# Patient Record
Sex: Male | Born: 1985 | Race: Black or African American | Hispanic: No | Marital: Single | Smoking: Former smoker
Health system: Southern US, Community
[De-identification: ages and names within clinical notes are randomized; demographics above are authoritative.]

---

## 2013-11-22 ENCOUNTER — Emergency Department (HOSPITAL_COMMUNITY): Payer: No Typology Code available for payment source

## 2013-11-22 ENCOUNTER — Emergency Department (HOSPITAL_COMMUNITY)
Admission: EM | Admit: 2013-11-22 | Discharge: 2013-11-22 | Disposition: A | Discharge status: Home / Self Care | Payer: No Typology Code available for payment source | Attending: Emergency Medicine | Admitting: Emergency Medicine

## 2013-11-22 ENCOUNTER — Encounter (HOSPITAL_COMMUNITY): Payer: Self-pay | Admitting: Emergency Medicine

## 2013-11-22 DIAGNOSIS — Z87891 Personal history of nicotine dependence: Secondary | ICD-10-CM | POA: Diagnosis not present

## 2013-11-22 DIAGNOSIS — M545 Low back pain, unspecified: Secondary | ICD-10-CM

## 2013-11-22 DIAGNOSIS — S199XXA Unspecified injury of neck, initial encounter: Secondary | ICD-10-CM | POA: Insufficient documentation

## 2013-11-22 DIAGNOSIS — M62838 Other muscle spasm: Secondary | ICD-10-CM

## 2013-11-22 DIAGNOSIS — S3992XA Unspecified injury of lower back, initial encounter: Secondary | ICD-10-CM | POA: Diagnosis not present

## 2013-11-22 DIAGNOSIS — Y92410 Unspecified street and highway as the place of occurrence of the external cause: Secondary | ICD-10-CM | POA: Insufficient documentation

## 2013-11-22 DIAGNOSIS — M546 Pain in thoracic spine: Secondary | ICD-10-CM

## 2013-11-22 DIAGNOSIS — R5383 Other fatigue: Secondary | ICD-10-CM | POA: Insufficient documentation

## 2013-11-22 DIAGNOSIS — S0990XA Unspecified injury of head, initial encounter: Secondary | ICD-10-CM | POA: Insufficient documentation

## 2013-11-22 DIAGNOSIS — Y9389 Activity, other specified: Secondary | ICD-10-CM | POA: Diagnosis not present

## 2013-11-22 MED ORDER — HYDROCODONE-ACETAMINOPHEN 5-325 MG PO TABS: 1.0000 | ORAL_TABLET | Freq: Four times a day (QID) | ORAL | Status: DC | PRN

## 2013-11-22 MED ORDER — HYDROCODONE-ACETAMINOPHEN 5-325 MG PO TABS
1.0000 | ORAL_TABLET | Freq: Once | ORAL | Status: AC
  Administered 2013-11-22: 1 via ORAL
  Filled 2013-11-22: qty 1

## 2013-11-22 MED ORDER — NAPROXEN 500 MG PO TABS: 500.0000 mg | ORAL_TABLET | Freq: Two times a day (BID) | ORAL | Status: AC

## 2013-11-22 MED ORDER — DIAZEPAM 5 MG PO TABS: 5.0000 mg | ORAL_TABLET | Freq: Four times a day (QID) | ORAL | Status: DC | PRN

## 2013-11-22 MED ORDER — DIAZEPAM 5 MG PO TABS
5.0000 mg | ORAL_TABLET | Freq: Once | ORAL | Status: AC
  Administered 2013-11-22: 5 mg via ORAL
  Filled 2013-11-22: qty 1
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
## 2013-11-23 NOTE — ED Provider Notes (Signed)
Medical screening examination/treatment/procedure(s) were performed by non-physician practitioner and as supervising physician I was immediately available for consultation/collaboration.   EKG Interpretation None       Derwood KaplanAnkit Deavon Podgorski, MD 11/23/13 [id number]

## 2013-12-01 ENCOUNTER — Encounter (HOSPITAL_COMMUNITY): Payer: Self-pay | Admitting: Emergency Medicine

## 2013-12-01 ENCOUNTER — Emergency Department (HOSPITAL_COMMUNITY)
Admission: EM | Admit: 2013-12-01 | Discharge: 2013-12-01 | Disposition: A | Discharge status: Home / Self Care | Payer: No Typology Code available for payment source | Attending: Emergency Medicine | Admitting: Emergency Medicine

## 2013-12-01 DIAGNOSIS — M546 Pain in thoracic spine: Secondary | ICD-10-CM | POA: Insufficient documentation

## 2013-12-01 DIAGNOSIS — Z7982 Long term (current) use of aspirin: Secondary | ICD-10-CM | POA: Insufficient documentation

## 2013-12-01 DIAGNOSIS — Z791 Long term (current) use of non-steroidal anti-inflammatories (NSAID): Secondary | ICD-10-CM | POA: Insufficient documentation

## 2013-12-01 DIAGNOSIS — Z79899 Other long term (current) drug therapy: Secondary | ICD-10-CM | POA: Diagnosis not present

## 2013-12-01 DIAGNOSIS — Z87891 Personal history of nicotine dependence: Secondary | ICD-10-CM | POA: Diagnosis not present

## 2013-12-01 DIAGNOSIS — M542 Cervicalgia: Secondary | ICD-10-CM | POA: Diagnosis not present

## 2013-12-01 DIAGNOSIS — M545 Low back pain: Secondary | ICD-10-CM | POA: Insufficient documentation

## 2013-12-01 DIAGNOSIS — M549 Dorsalgia, unspecified: Secondary | ICD-10-CM

## 2013-12-01 DIAGNOSIS — Z760 Encounter for issue of repeat prescription: Secondary | ICD-10-CM | POA: Diagnosis present

## 2013-12-01 MED ORDER — HYDROCODONE-ACETAMINOPHEN 5-325 MG PO TABS: 1.0000 | ORAL_TABLET | ORAL | Status: AC | PRN

## 2013-12-01 MED ORDER — METHOCARBAMOL 500 MG PO TABS: 500.0000 mg | ORAL_TABLET | Freq: Two times a day (BID) | ORAL | Status: AC
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
## 2013-12-01 NOTE — Discharge Instructions (Signed)
Take the prescribed medication as directed. Follow-up with orthopedics for further evaluation if desired. Return to the ED for new or worsening symptoms.
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
## 2019-01-31 ENCOUNTER — Encounter (HOSPITAL_COMMUNITY): Payer: Self-pay | Admitting: Emergency Medicine

## 2019-01-31 ENCOUNTER — Other Ambulatory Visit: Payer: Self-pay

## 2019-01-31 ENCOUNTER — Ambulatory Visit (HOSPITAL_COMMUNITY)
Admission: EM | Admit: 2019-01-31 | Discharge: 2019-01-31 | Disposition: A | Discharge status: Home / Self Care | Payer: HRSA Program | Attending: Internal Medicine | Admitting: Internal Medicine

## 2019-01-31 DIAGNOSIS — U071 COVID-19: Secondary | ICD-10-CM | POA: Insufficient documentation

## 2019-01-31 DIAGNOSIS — Z20822 Contact with and (suspected) exposure to covid-19: Secondary | ICD-10-CM | POA: Diagnosis present

## 2019-01-31 DIAGNOSIS — R059 Cough, unspecified: Secondary | ICD-10-CM

## 2019-01-31 DIAGNOSIS — R05 Cough: Secondary | ICD-10-CM | POA: Diagnosis present

## 2019-01-31 DIAGNOSIS — B349 Viral infection, unspecified: Secondary | ICD-10-CM | POA: Diagnosis present

## 2019-01-31 MED ORDER — BENZONATATE 100 MG PO CAPS: 100.0000 mg | ORAL_CAPSULE | Freq: Three times a day (TID) | ORAL | 0 refills | Status: AC | PRN

## 2019-01-31 MED ORDER — PROMETHAZINE-DM 6.25-15 MG/5ML PO SYRP: 5.0000 mL | ORAL_SOLUTION | Freq: Every evening | ORAL | 0 refills | Status: AC | PRN

## 2019-01-31 NOTE — ED Triage Notes (Signed)
Pt here to get tested for COVID.Steven Galvan. reports recent exposure  Sx today include: cough  Denies: fever, SOB/dyspnea  A&O x4... NAD.Steven Galvan. ambulatory

## 2019-01-31 NOTE — Discharge Instructions (Addendum)

## 2019-01-31 NOTE — ED Provider Notes (Signed)
[place]   MRN: [id number] DOB: [birth year]  Subjective:   Steven Galvan is a 34 y.o. male presenting for 2 to 3-day history of dry cough that has been improving.  Patient had recent exposure to Covid positive patient.  He is very worried about this given that he is already lost 1 family member to the infection.  Denies chronic lung disorder, asthma or COPD.  Patient is not a smoker.  Denies chronic conditions.  Denies taking chronic medications.  No Known Allergies  History reviewed. No pertinent past medical history.   History reviewed. No pertinent surgical history.  History reviewed. No pertinent family history.  Social History   Tobacco Use  . Smoking status: Former Research scientist (life sciences)  . Smokeless tobacco: Never Used  Substance Use Topics  . Alcohol use: No  . Drug use: No    Review of Systems  Constitutional: Negative for fever and malaise/fatigue.  HENT: Negative for congestion, ear pain, sinus pain and sore throat.   Eyes: Negative for discharge and redness.  Respiratory: Positive for cough. Negative for hemoptysis, shortness of breath and wheezing.   Cardiovascular: Negative for chest pain.  Gastrointestinal: Negative for abdominal pain, diarrhea, nausea and vomiting.  Genitourinary: Negative for dysuria, flank pain and hematuria.  Musculoskeletal: Negative for myalgias.  Skin: Negative for rash.  Neurological: Negative for dizziness, weakness and headaches.  Psychiatric/Behavioral: Negative for depression and substance abuse.     Objective:   Vitals: BP 115/89 (BP Location: Left Arm)   Pulse 78   Temp 99.3 F (37.4 C) (Oral)   Resp 14   SpO2 95%   Physical Exam Constitutional:      General: He is not in acute distress.    Appearance: Normal appearance. He is well-developed. He is not ill-appearing, toxic-appearing or diaphoretic.  HENT:     Head: Normocephalic and atraumatic.     Right Ear: External ear normal.     Left Ear: External ear normal.      Nose: Nose normal.     Mouth/Throat:     Mouth: Mucous membranes are moist.     Pharynx: Oropharynx is clear.  Eyes:     General: No scleral icterus.    Extraocular Movements: Extraocular movements intact.     Pupils: Pupils are equal, round, and reactive to light.  Cardiovascular:     Rate and Rhythm: Normal rate and regular rhythm.     Heart sounds: Normal heart sounds. No murmur. No friction rub. No gallop.   Pulmonary:     Effort: Pulmonary effort is normal. No respiratory distress.     Breath sounds: Normal breath sounds. No stridor. No wheezing, rhonchi or rales.  Neurological:     Mental Status: He is alert and oriented to person, place, and time.  Psychiatric:        Mood and Affect: Mood normal.        Behavior: Behavior normal.        Thought Content: Thought content normal.        Judgment: Judgment normal.      Assessment and Plan :   1. Viral illness   2. Cough   3. Exposure to COVID-19 virus     Will manage for viral illness such as viral URI, viral rhinitis, possible COVID-19. Counseled patient on nature of COVID-19 including modes of transmission, diagnostic testing, management and supportive care.  Offered symptomatic relief. COVID 19 testing is pending. Counseled patient on potential for adverse effects with medications  prescribed/recommended today, ER and return-to-clinic precautions discussed, patient verbalized understanding.     Wallis Bamberg, PA-C 02/01/19 1025

## 2019-02-02 LAB — NOVEL CORONAVIRUS, NAA (HOSP ORDER, SEND-OUT TO REF LAB; TAT 18-24 HRS): SARS-CoV-2, NAA: DETECTED — AB

## 2019-02-03 ENCOUNTER — Telehealth (HOSPITAL_COMMUNITY): Payer: Self-pay | Admitting: Emergency Medicine

## 2019-02-03 NOTE — Telephone Encounter (Signed)
Your test for COVID-19 was positive, meaning that you were infected with the novel coronavirus and could give the germ to others.  Please continue isolation at home for at least 10 days since the start of your symptoms. If you do not have symptoms, please isolate at home for 10 days from the day you were tested. Once you complete your 10 day quarantine, you may return to normal activities as long as you've not had a fever for over 24 hours(without taking fever reducing medicine) and your symptoms are improving. Please continue good preventive care measures, including:  frequent hand-washing, avoid touching your face, cover coughs/sneezes, stay out of crowds and keep a 6 foot distance from others.  Go to the nearest hospital emergency room if fever/cough/breathlessness are severe or illness seems like a threat to life.  Patient contacted by phone and made aware of    results. Pt verbalized understanding and had all questions answered.  Quarantine ends Jan 12th.
